# Patient Record
Sex: Male | Born: 2000 | Race: White | Hispanic: No | Marital: Single | Smoking: Never smoker
Health system: Southern US, Community
[De-identification: ages and names within clinical notes are randomized; demographics above are authoritative.]

## PROBLEM LIST (undated history)

## (undated) HISTORY — PX: HERNIA REPAIR: SHX51

---
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
## 2020-09-25 IMAGING — DX DG ANKLE COMPLETE 3+V*R*
1 series · 3 of 3 positions shown · non-contrast
Comparison: None.

CLINICAL DATA: Lateral soft tissue swelling, redness.

EXAM:
RIGHT ANKLE - COMPLETE 3+ VIEW

[Series 1: ankle · 0.14mm/px · 3 of 3 slices shown]
[im 1/3]
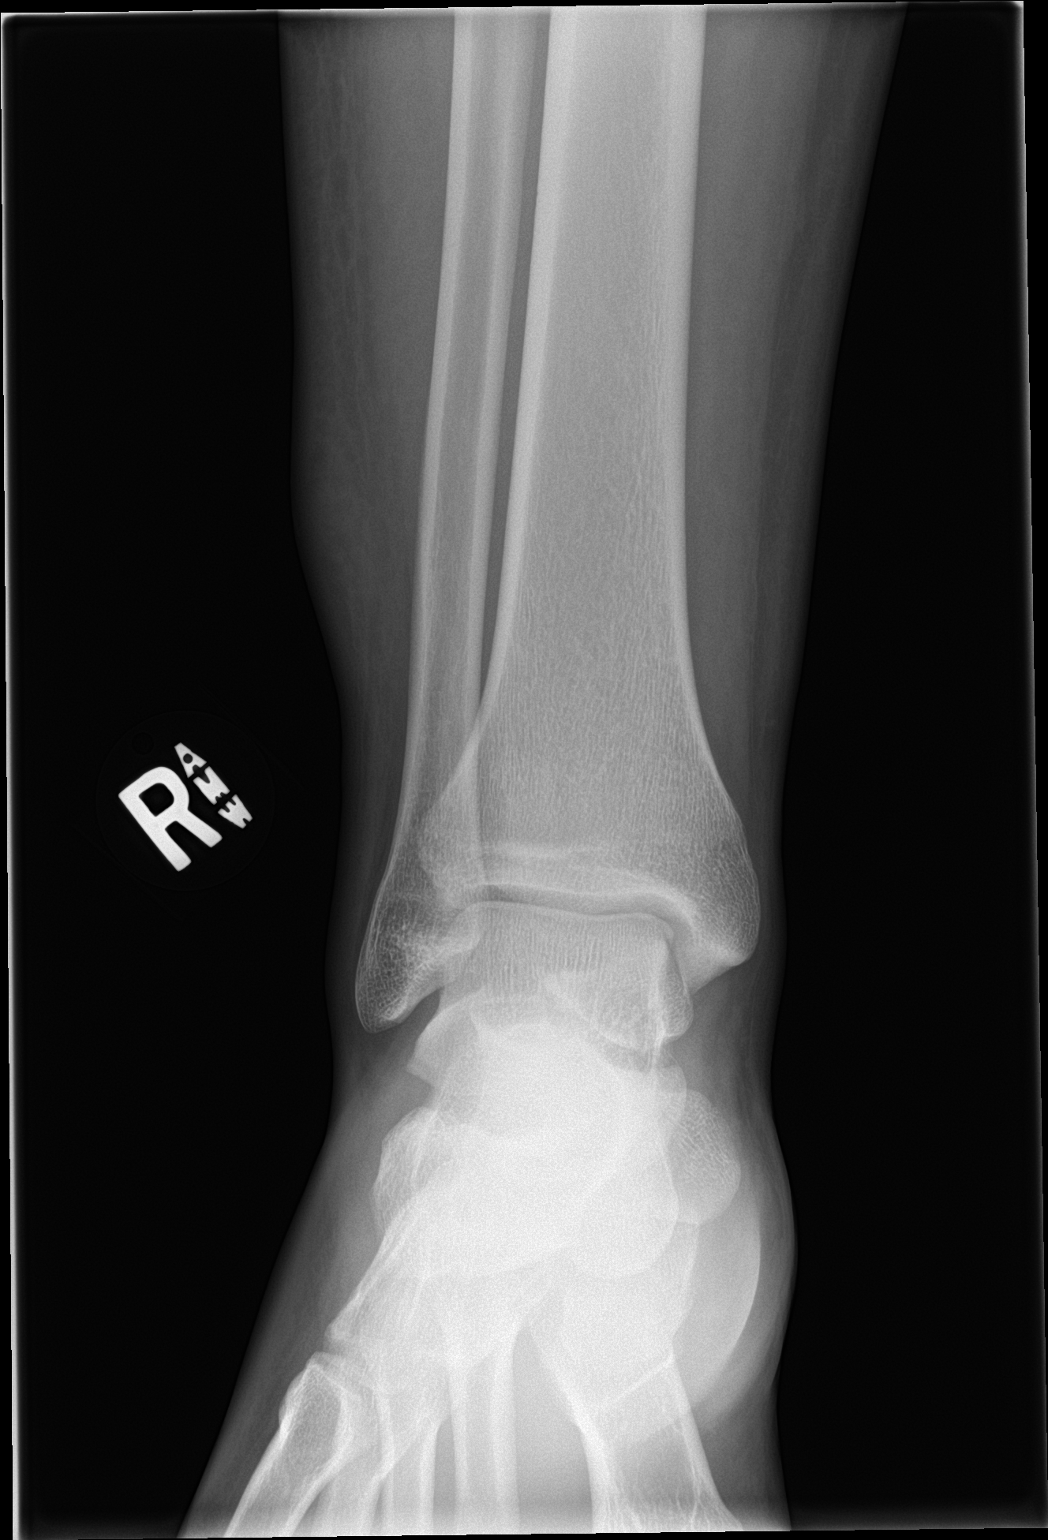
[im 2/3]
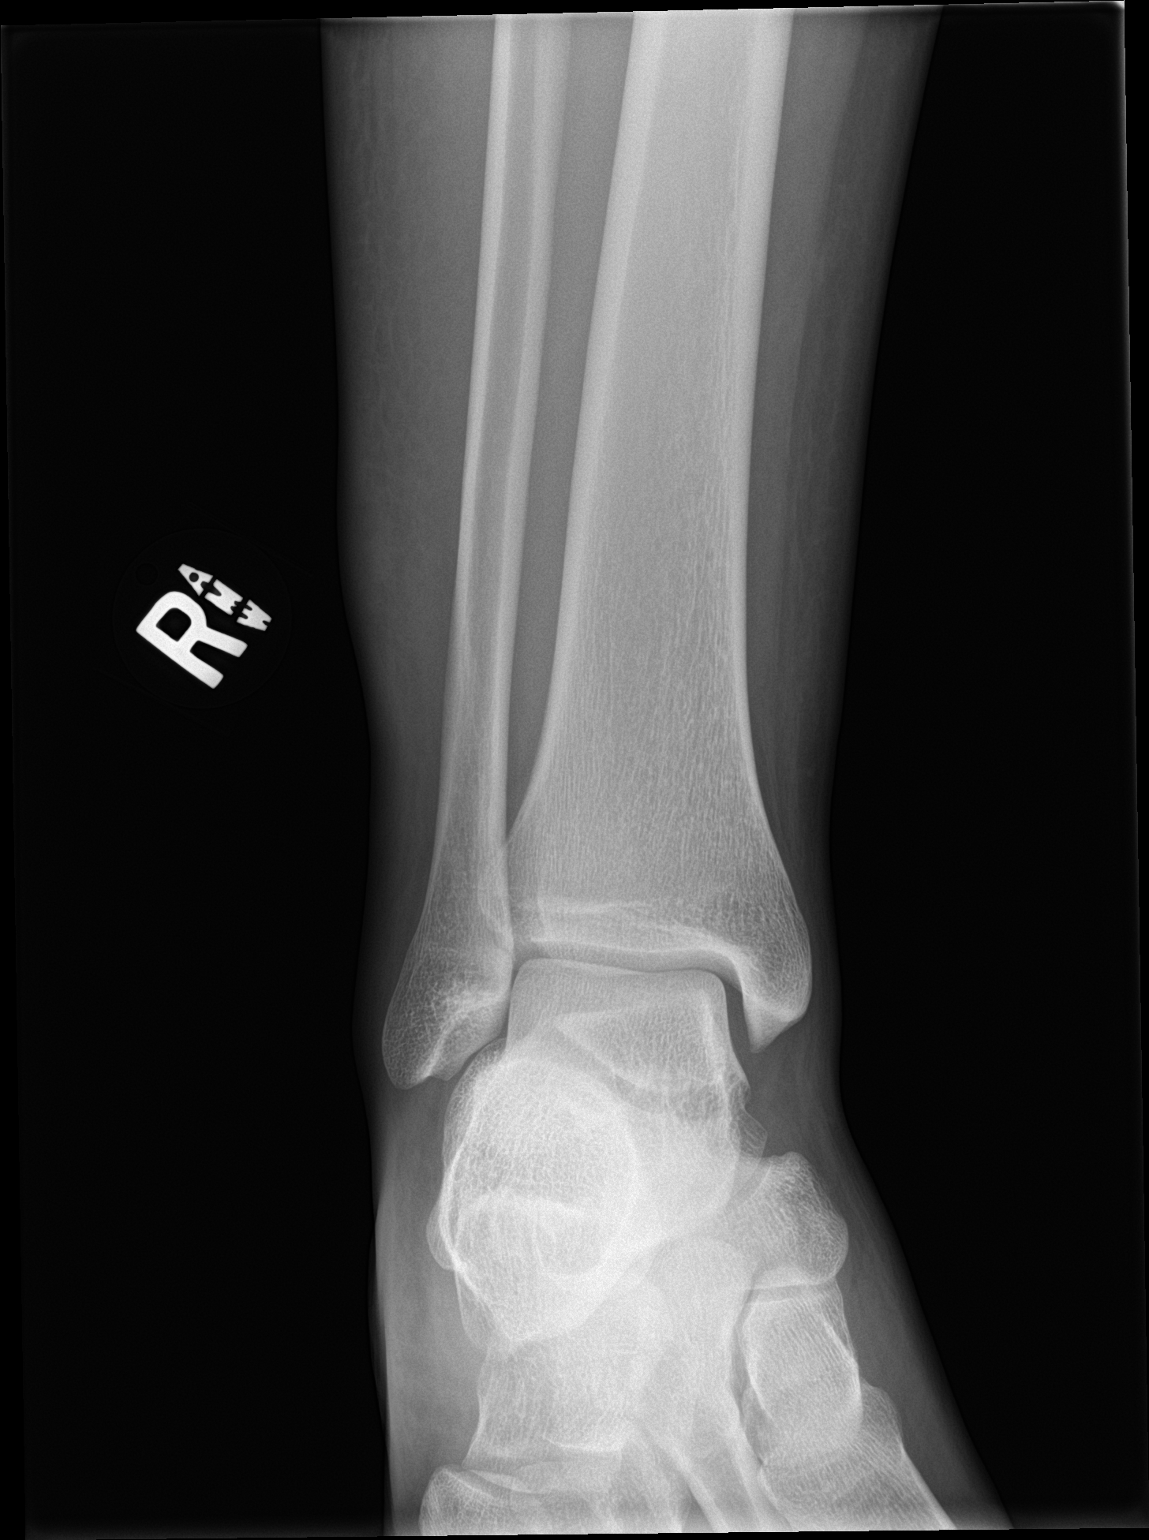
[im 3/3]
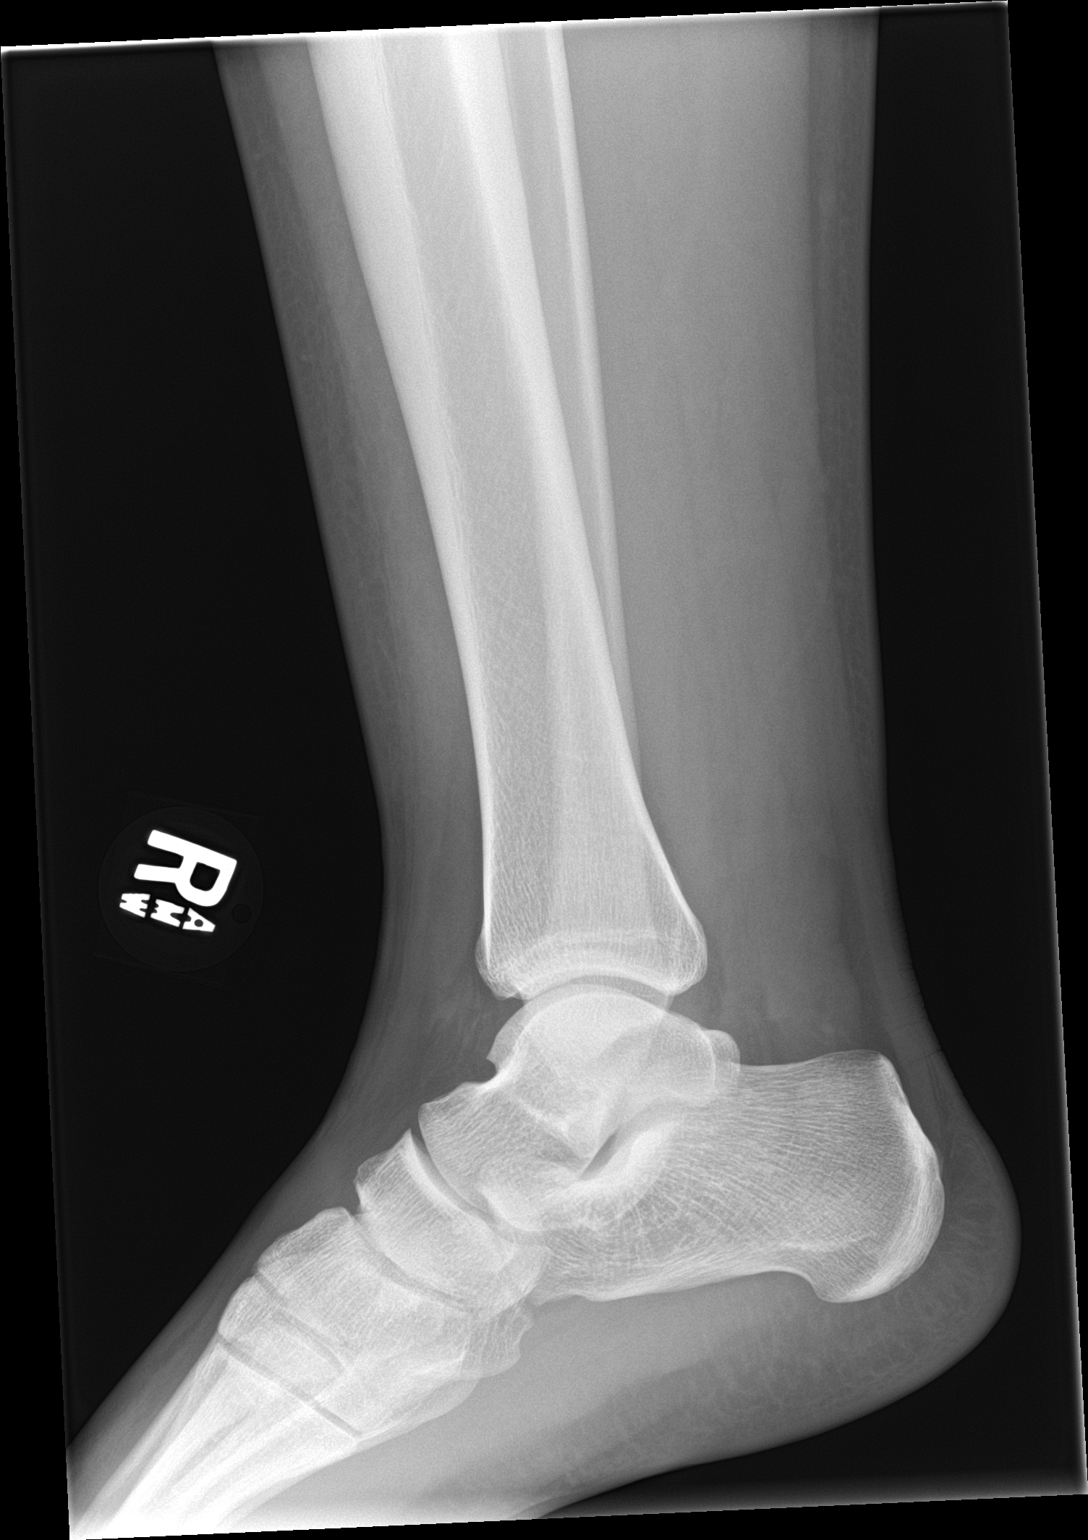

[3 of 3 positions shown; findings below may reference images not displayed]

FINDINGS: Soft tissue swelling in the right lower lateral calf. No underlying
bony abnormality. No radiographic changes of osteomyelitis. No acute
bony abnormality. Specifically, no fracture, subluxation, or
dislocation.
IMPRESSION: No acute bony abnormality.
